# Patient Record
Sex: Male | Born: 1980 | Race: Black or African American | Hispanic: No | Marital: Married | Smoking: Current every day smoker
Health system: Southern US, Community
[De-identification: ages and names within clinical notes are randomized; demographics above are authoritative.]

## PROBLEM LIST (undated history)

## (undated) HISTORY — PX: ANKLE SURGERY: SHX546

---
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
## 2004-06-23 ENCOUNTER — Emergency Department (HOSPITAL_COMMUNITY): Admission: EM | Admit: 2004-06-23 | Discharge: 2004-06-23 | Payer: Self-pay | Admitting: Family Medicine

## 2005-08-21 ENCOUNTER — Encounter: Payer: Self-pay | Admitting: Emergency Medicine

## 2007-06-18 ENCOUNTER — Emergency Department (HOSPITAL_COMMUNITY): Admission: EM | Admit: 2007-06-18 | Discharge: 2007-06-19 | Payer: Self-pay | Admitting: Emergency Medicine

## 2008-12-23 IMAGING — CR DG HAND COMPLETE 3+V*L*
3 series · 3 of 3 positions shown · non-contrast
Comparison: none

CLINICAL DATA: Watch pin stuck in hand. 
 LEFT HAND ? 3 VIEW:

[x hand pa left]
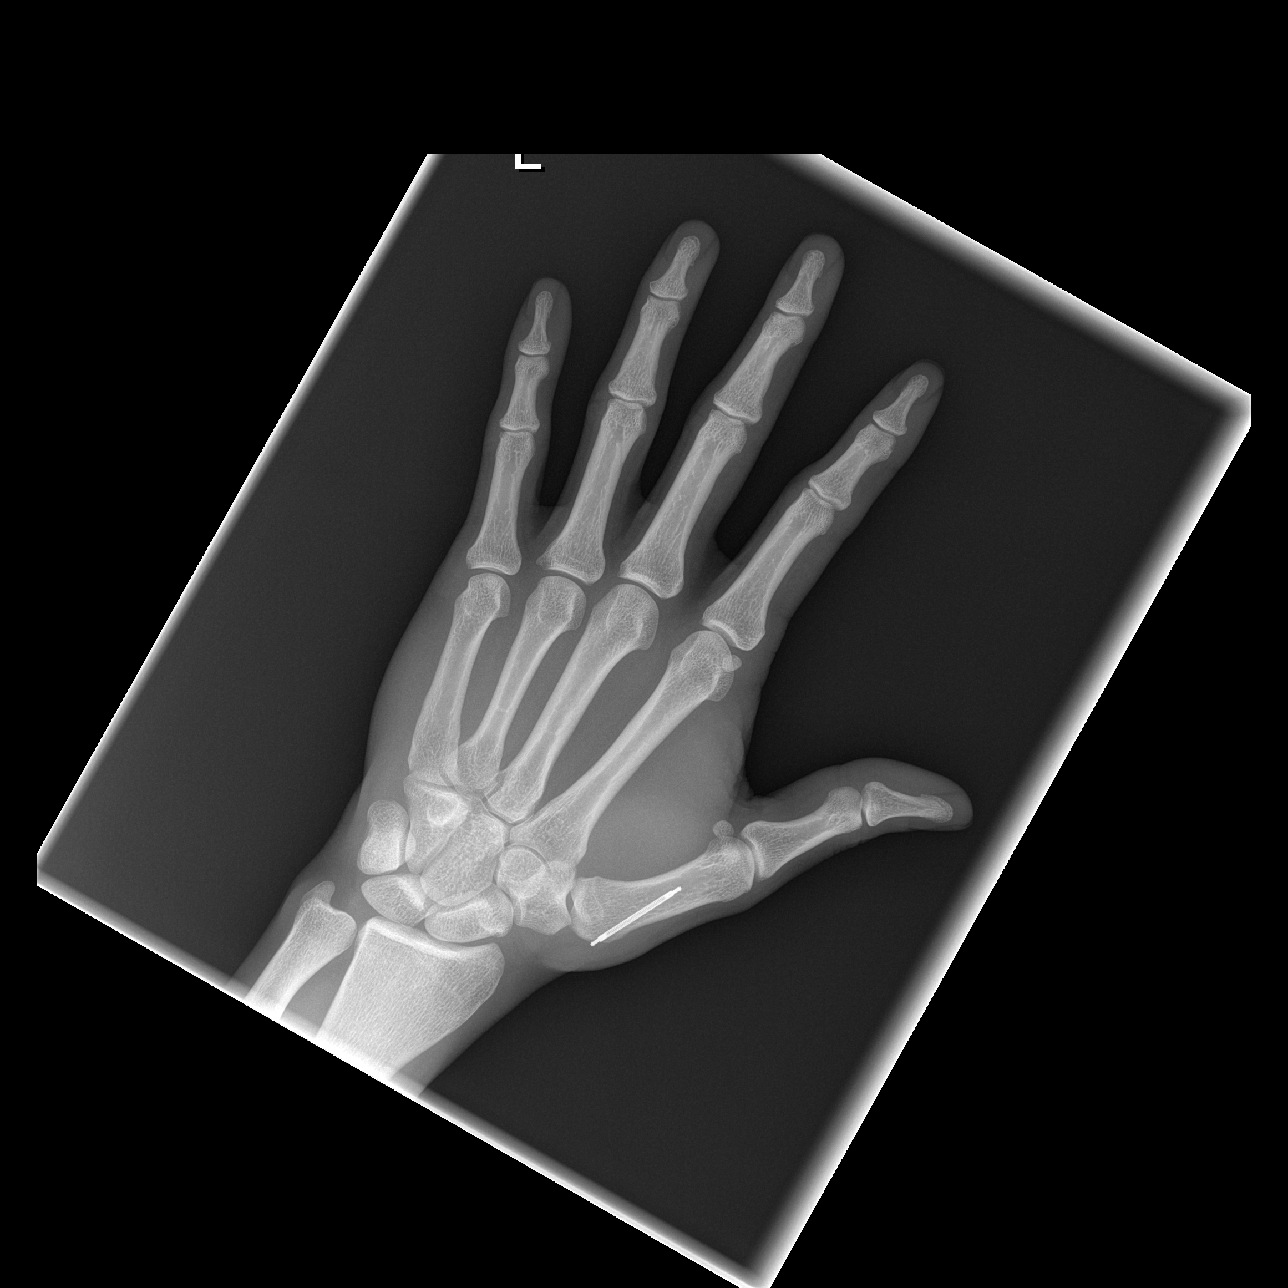

[x hand oblique left]
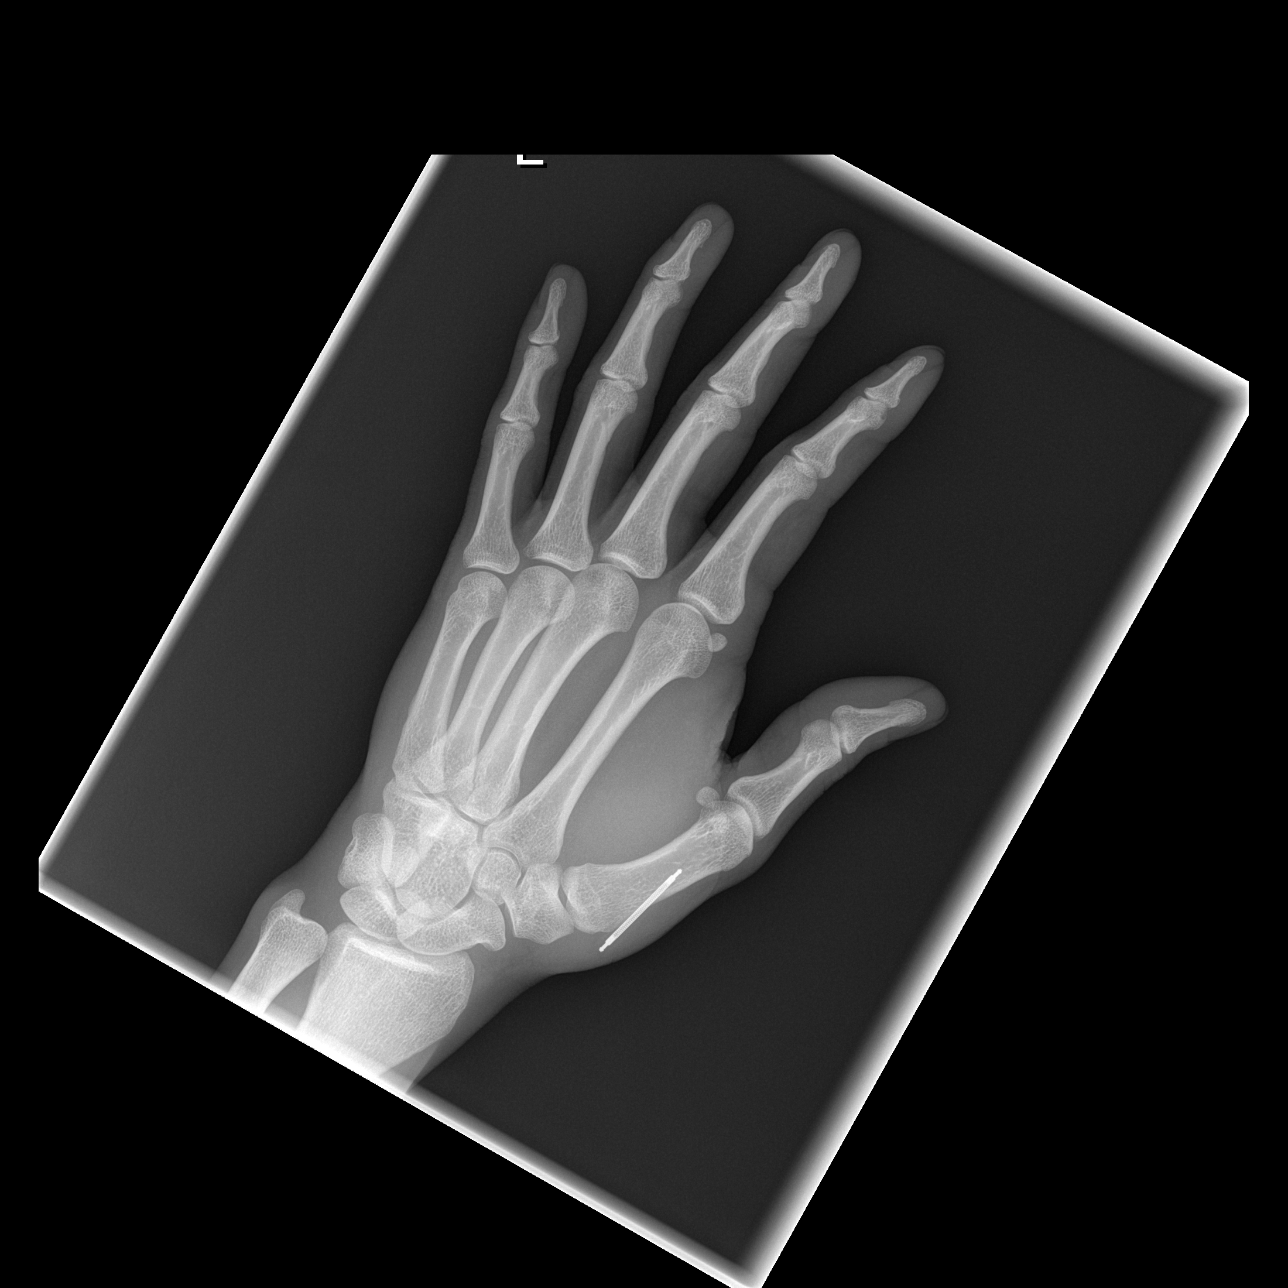

[x hand lat left]
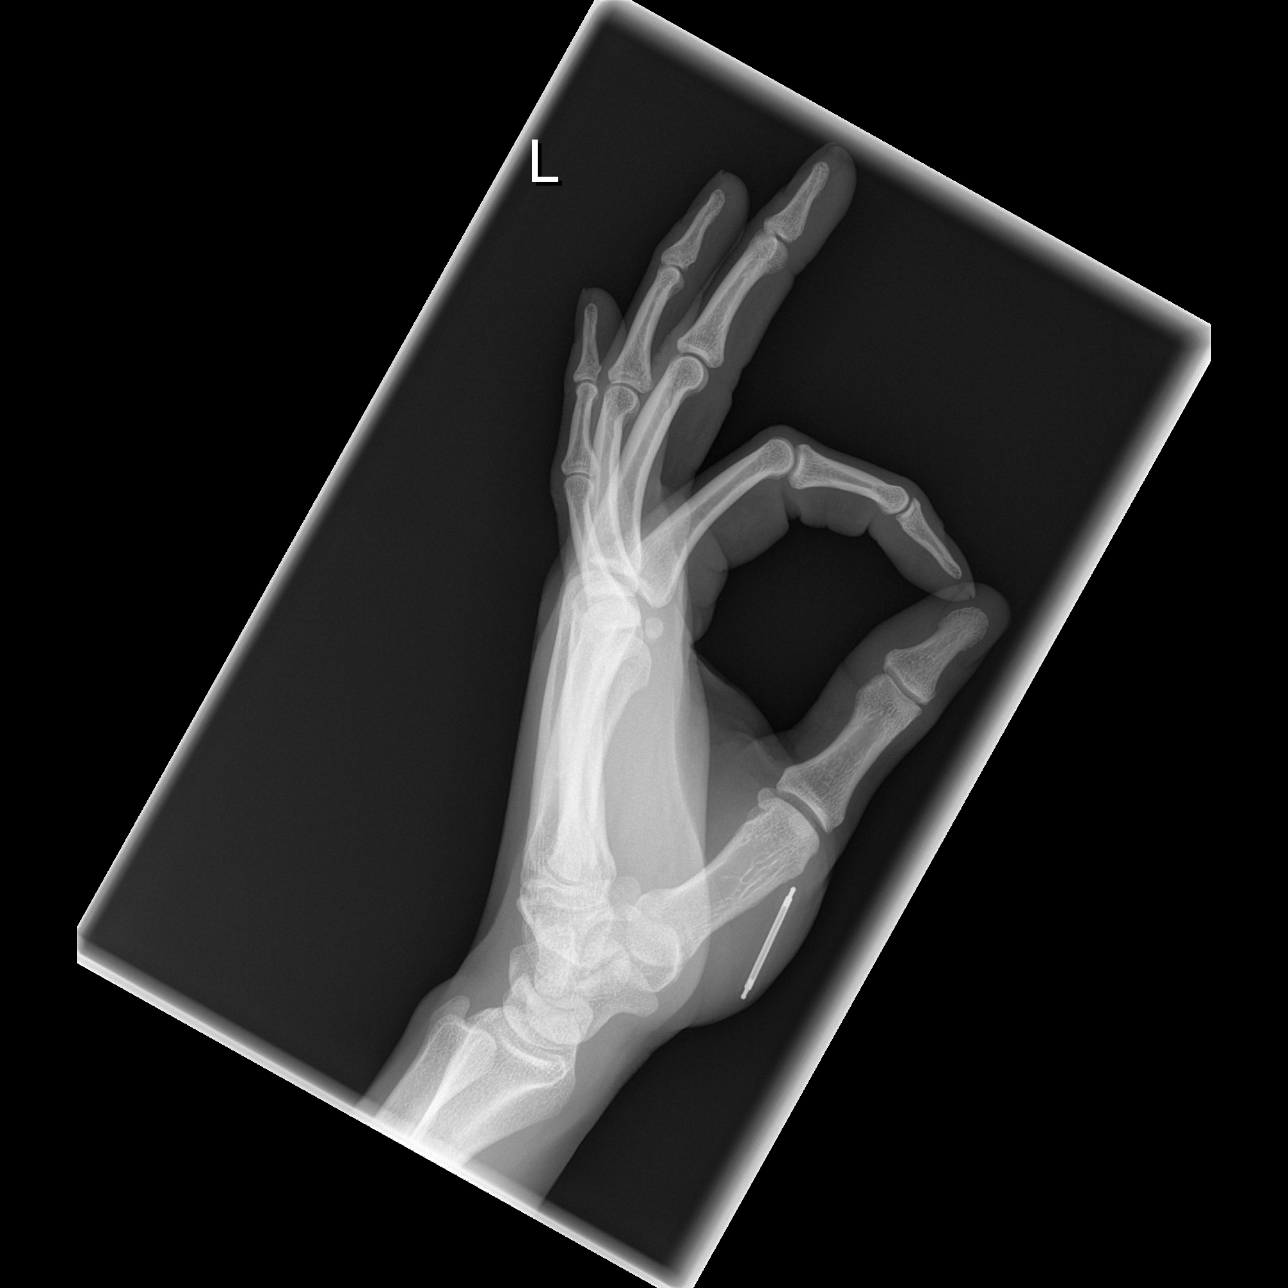

[3 of 3 positions shown; findings below may reference images not displayed]

FINDINGS: There is a radiopaque object in the thenar eminence region that looks like a watch pin.  This does not appear to affect the bone or show any intraarticular communication.
IMPRESSION: As discussed above.

## 2010-05-31 ENCOUNTER — Inpatient Hospital Stay (INDEPENDENT_AMBULATORY_CARE_PROVIDER_SITE_OTHER)
Admission: RE | Admit: 2010-05-31 | Discharge: 2010-05-31 | Disposition: A | Discharge status: Home / Self Care | Payer: Self-pay | Source: Ambulatory Visit | Attending: Family Medicine | Admitting: Family Medicine

## 2010-05-31 DIAGNOSIS — M25569 Pain in unspecified knee: Secondary | ICD-10-CM

## 2010-06-18 ENCOUNTER — Ambulatory Visit: Payer: Self-pay | Attending: Orthopedic Surgery

## 2010-09-04 NOTE — Op Note (Signed)
NAME:  JHON, MALLOZZI NO.:  [id number]   MEDICAL RECORD NO.:  [id number]          PATIENT TYPE:  EMS   LOCATION:  MAJO                         FACILITY:  [facility]   PHYSICIAN:  Dionne Ano. Gramig III, M.D.DATE OF BIRTH:  [birth year]   DATE OF PROCEDURE:  06/18/2007  DATE OF DISCHARGE:                               OPERATIVE REPORT   Lochlan Grygiel presents to the [facility] Emergency Room for evaluation  of his left thumb after embedding a 1 inch watch band metal part into  his thenar space deeply. He was seen by the emergency room staff. Given  the complexity, I was asked to see him.  He has been given a tetanus  shot.  He notes no other complaints.   ALLERGIES:  None.   MEDICINES:  None.   PAST SURGICAL HISTORY:  Ankle surgery.   PAST MEDICAL HISTORY:  None.   SOCIAL HISTORY:  He has a place of employment at [employer].  He has  a significant other and a child with him tonight.   The examination is highlighted by an entrance wound in the thenar space.  He has limited open area where exploration by the ER staff was  unsuccessful.  He has no evidence of gross flexor tendon disruption or  obvious dystrophy or infection.  I have reviewed this at length and his  findings.   The patient has x-rays revealing an imbedded object consistent with a  watch band attachment in his thenar space.   I have verbally consented him for I&D and removal of the apparatus.  The  patient was taken to the procedure suite.  He underwent additional  infiltration block and following this, under fluoro, I was able to  localize the object. Once this was complete, I then performed removal  with an open technique utilizing a 15 blade and utilizing sterile  instruments.  This was removed. The area was irrigated and closed with  Prolene.   Following this, I discussed with him elevation, massage, and a dressing  change in five days.  I have written him out of work until Monday.  I  have asked him to return to the office in 7-10 days for follow up and  suture removal at which time we will simply remove his sutures and allow  him activities to tolerance.  I have given him Keflex x10 days, 500 mg  one p.o. q.i.d. and Vicodin p.r.n. pain.  All questions have been  encouraged and answered.           ______________________________  Dionne Ano. Everlene Other, M.D.    Nash Mantis  D:  06/19/2007  T:  06/19/2007  Job:  [id number]   cc:   Hilario Quarry, M.D.

## 2011-07-26 ENCOUNTER — Emergency Department (HOSPITAL_COMMUNITY)
Admission: EM | Admit: 2011-07-26 | Discharge: 2011-07-26 | Disposition: A | Discharge status: Home Health | Payer: Self-pay | Source: Home / Self Care | Attending: Family Medicine | Admitting: Family Medicine

## 2011-07-26 ENCOUNTER — Encounter (HOSPITAL_COMMUNITY): Payer: Self-pay

## 2011-07-26 DIAGNOSIS — S0180XA Unspecified open wound of other part of head, initial encounter: Secondary | ICD-10-CM

## 2011-07-26 DIAGNOSIS — S01112A Laceration without foreign body of left eyelid and periocular area, initial encounter: Secondary | ICD-10-CM

## 2011-07-26 MED ORDER — MUPIROCIN CALCIUM 2 % EX CREA: TOPICAL_CREAM | Freq: Three times a day (TID) | CUTANEOUS | Status: AC

## 2011-07-26 NOTE — ED Notes (Signed)
Pt c/o laceration above L eyebrow.  Pt states he collided head to head while playing basketball, denies LOC.  Bleeding controlled upon arrival.

## 2011-07-26 NOTE — Discharge Instructions (Signed)
Follow laceration care instructions below. Let dry well before putting antibiotic ointment.  Can cover it going outside the house. Return in 5 days for suture removal. Return earlier if redness, pain swelling or drainage. Go to the emergency department if dizziness, balance problem, gait problems, memory problems, visual problems, nausea, vomiting, seizures or persistent headache.    Laceration Care, Adult A laceration is a cut or lesion that goes through all layers of the skin and into the tissue just beneath the skin. TREATMENT  Some lacerations may not require closure. Some lacerations may not be able to be closed due to an increased risk of infection. It is important to see your caregiver as soon as possible after an injury to minimize the risk of infection and maximize the opportunity for successful closure. If closure is appropriate, pain medicines may be given, if needed. The wound will be cleaned to help prevent infection. Your caregiver will use stitches (sutures), staples, wound glue (adhesive), or skin adhesive strips to repair the laceration. These tools bring the skin edges together to allow for faster healing and a better cosmetic outcome. However, all wounds will heal with a scar. Once the wound has healed, scarring can be minimized by covering the wound with sunscreen during the day for 1 full year. HOME CARE INSTRUCTIONS  For sutures or staples:  Keep the wound clean and dry.   If you were given a bandage (dressing), you should change it at least once a day. Also, change the dressing if it becomes wet or dirty, or as directed by your caregiver.   Wash the wound with soap and water 2 times a day. Rinse the wound off with water to remove all soap. Pat the wound dry with a clean towel.   After cleaning, apply a thin layer of the antibiotic ointment as recommended by your caregiver. This will help prevent infection and keep the dressing from sticking.   You may shower as usual  after the first 24 hours. Do not soak the wound in water until the sutures are removed.   Only take over-the-counter or prescription medicines for pain, discomfort, or fever as directed by your caregiver.   Get your sutures or staples removed as directed by your caregiver.  For skin adhesive strips:  Keep the wound clean and dry.   Do not get the skin adhesive strips wet. You may bathe carefully, using caution to keep the wound dry.   If the wound gets wet, pat it dry with a clean towel.   Skin adhesive strips will fall off on their own. You may trim the strips as the wound heals. Do not remove skin adhesive strips that are still stuck to the wound. They will fall off in time.  For wound adhesive:  You may briefly wet your wound in the shower or bath. Do not soak or scrub the wound. Do not swim. Avoid periods of heavy perspiration until the skin adhesive has fallen off on its own. After showering or bathing, gently pat the wound dry with a clean towel.   Do not apply liquid medicine, cream medicine, or ointment medicine to your wound while the skin adhesive is in place. This may loosen the film before your wound is healed.   If a dressing is placed over the wound, be careful not to apply tape directly over the skin adhesive. This may cause the adhesive to be pulled off before the wound is healed.   Avoid prolonged exposure to sunlight or  tanning lamps while the skin adhesive is in place. Exposure to ultraviolet light in the first year will darken the scar.   The skin adhesive will usually remain in place for 5 to 10 days, then naturally fall off the skin. Do not pick at the adhesive film.  You may need a tetanus shot if:  You cannot remember when you had your last tetanus shot.   You have never had a tetanus shot.  If you get a tetanus shot, your arm may swell, get red, and feel warm to the touch. This is common and not a problem. If you need a tetanus shot and you choose not to have  one, there is a rare chance of getting tetanus. Sickness from tetanus can be serious. SEEK MEDICAL CARE IF:   You have redness, swelling, or increasing pain in the wound.   You see a red line that goes away from the wound.   You have yellowish-white fluid (pus) coming from the wound.   You have a fever.   You notice a bad smell coming from the wound or dressing.   Your wound breaks open before or after sutures have been removed.   You notice something coming out of the wound such as wood or glass.   Your wound is on your hand or foot and you cannot move a finger or toe.  SEEK IMMEDIATE MEDICAL CARE IF:   Your pain is not controlled with prescribed medicine.   You have severe swelling around the wound causing pain and numbness or a change in color in your arm, hand, leg, or foot.   Your wound splits open and starts bleeding.   You have worsening numbness, weakness, or loss of function of any joint around or beyond the wound.   You develop painful lumps near the wound or on the skin anywhere on your body.  MAKE SURE YOU:   Understand these instructions.   Will watch your condition.   Will get help right away if you are not doing well or get worse.  Document Released: 04/08/2005 Document Revised: 03/28/2011 Document Reviewed: 10/02/2010 [facility] Patient Information 2012 [place], [place].

## 2011-07-29 NOTE — ED Provider Notes (Signed)
History     CSN: [id number]  Arrival date & time 07/26/11  1700   First MD Initiated Contact with Patient 07/26/11 1713      Chief Complaint  Patient presents with  . Laceration    (Consider location/radiation/quality/duration/timing/severity/associated sxs/prior treatment) HPI Comments: 31 y/o otherwise healthy here c/o a laceration in forehead after a head to head collision with another basketball player during a game. Denies dizziness or loss of consciousness, denies nausea or headache. No visual problems from base line. (patient had a penetrating injury to left eye years ago). Bleeding has stopped. Injury happened about 1 hour ago.    History reviewed. No pertinent past medical history.  Past Surgical History  Procedure Date  . Ankle surgery     No family history on file.  History  Substance Use Topics  . Smoking status: Current Everyday Smoker  . Smokeless tobacco: Not on file  . Alcohol Use: Yes      Review of Systems  Skin:       Forehead laceration as per HPI.  Neurological: Negative for dizziness, seizures, weakness, light-headedness, numbness and headaches.  All other systems reviewed and are negative.    Allergies  Review of patient's allergies indicates no known allergies.  Home Medications   Current Outpatient Rx  Name Route Sig Dispense Refill  . MUPIROCIN CALCIUM 2 % EX CREA Topical Apply topically 3 (three) times daily. 15 g 0    BP 124/55  Pulse 56  Temp(Src) 97.3 F (36.3 C) (Oral)  Resp 18  SpO2 100%  Physical Exam  Nursing note and vitals reviewed. Constitutional: He is oriented to person, place, and time. He appears well-developed and well-nourished. No distress.  HENT:       Forehead laceration, about 5 cm. Horizontal just above left eyebrow. Hemostatic.  Eyes: Conjunctivae and EOM are normal. No scleral icterus.       Pupil irregular on left side from prior penetrating eye injury.  Neck: Normal range of motion. Neck supple.    Cardiovascular: Normal heart sounds.   Pulmonary/Chest: Breath sounds normal.  Neurological: He is alert and oriented to person, place, and time. He has normal strength. No cranial nerve deficit or sensory deficit. He displays a negative Romberg sign. Coordination normal.  Psychiatric: His behavior is normal.    ED Course  LACERATION REPAIR Performed by: Sharin Grave Authorized by: Sharin Grave Consent: Verbal consent obtained. Risks and benefits: risks, benefits and alternatives were discussed Consent given by: patient Body area: head/neck Location details: left eyebrow Laceration length: 5 cm Foreign bodies: no foreign bodies Tendon involvement: none Nerve involvement: none Vascular damage: no Anesthesia: local infiltration Local anesthetic: lidocaine 1% with epinephrine Anesthetic total: 3 ml Preparation: Patient was prepped and draped in the usual sterile fashion. Irrigation solution: saline Irrigation method: syringe Amount of cleaning: standard Debridement: none Degree of undermining: none Skin closure: 5-0 Prolene Subcutaneous closure: 6-0 Chromic gut Number of sutures: 12 Technique: simple Approximation: close Approximation difficulty: simple Dressing: antibiotic ointment and 4x4 sterile gauze Patient tolerance: Patient tolerated the procedure well with no immediate complications.   (including critical care time)  Labs Reviewed - No data to display No results found.   1. Laceration of left eyebrow       MDM  Left forehead laceration s/o rapaire in two layers, with 3 subcutaneus sutures with chromic and 9 simple prolene skin sutures. Wound care instructions provided asked to return in 5 days for suture removal.  Sharin Grave, MD 07/29/11 [phone]

## 2011-08-01 ENCOUNTER — Emergency Department (INDEPENDENT_AMBULATORY_CARE_PROVIDER_SITE_OTHER)
Admission: EM | Admit: 2011-08-01 | Discharge: 2011-08-01 | Disposition: A | Discharge status: Home / Self Care | Payer: Self-pay | Source: Home / Self Care | Attending: Emergency Medicine | Admitting: Emergency Medicine

## 2011-08-01 ENCOUNTER — Encounter (HOSPITAL_COMMUNITY): Payer: Self-pay | Admitting: Cardiology

## 2011-08-01 DIAGNOSIS — Z4802 Encounter for removal of sutures: Secondary | ICD-10-CM

## 2011-08-01 NOTE — ED Notes (Signed)
Pt here for suture removal from left eyebrow. No drainage/fever. Edges well approximated. Tenderness at suture area. Pt had impact to the area initially.

## 2011-08-01 NOTE — Discharge Instructions (Signed)
Go to www.goodrx.com to look up your medications. This will give you a list of where you can find your prescriptions at the most affordable prices.   RESOURCE GUIDE  No Primary Care Doctor Call [facility]  [phone] Other agencies that provide inexpensive medical care    [facility]  [phone]    [facility]  [phone]    [facility]  [phone]    [facility]  [phone] [place] [address]    [facility]  [phone]    [facility]  [phone] [facility] [phone]   [address] [place], [place] [address]  [place] [facility]  [facility] [place]     [employer]                          [facility] [address] [place]                       [address]      [address]   [phone] (After Hours)   

## 2011-08-01 NOTE — ED Provider Notes (Signed)
History     CSN: [id number]  Arrival date & time 08/01/11  4098   First MD Initiated Contact with Patient 08/01/11 1046      Chief Complaint  Patient presents with  . Suture / Staple Removal    (Consider location/radiation/quality/duration/timing/severity/associated sxs/prior treatment) HPI Comments: Patient status post laceration repair 6 days ago. Has been applying Bactroban as directed. Patient currently without complaints.  Patient is a 31 y.o. male presenting with suture removal. No language interpreter was used.  Suture / Staple Removal  The sutures were placed 3 to 6 days ago. Treatments since wound repair include antibiotic ointment use and regular soap and water washings. There has been no drainage from the wound. There is no redness present. There is no swelling present. The pain has no pain. He has no difficulty moving the affected extremity or digit.    History reviewed. No pertinent past medical history.  Past Surgical History  Procedure Date  . Ankle surgery     No family history on file.  History  Substance Use Topics  . Smoking status: Current Everyday Smoker -- 0.1 packs/day  . Smokeless tobacco: Not on file  . Alcohol Use: Yes     occas      Review of Systems  Allergies  Review of patient's allergies indicates no known allergies.  Home Medications   Current Outpatient Rx  Name Route Sig Dispense Refill  . MUPIROCIN CALCIUM 2 % EX CREA Topical Apply topically 3 (three) times daily. 15 g 0    BP 105/69  Pulse 57  Temp(Src) 98.2 F (36.8 C) (Oral)  Resp 12  SpO2 100%  Physical Exam  Nursing note and vitals reviewed. Constitutional: He is oriented to person, place, and time. He appears well-developed and well-nourished.  HENT:  Head: Normocephalic and atraumatic.         Well-healed laceration at left eyebrow. 9 Prolene sutures intact.  Eyes: Conjunctivae and EOM are normal.  Neck: Normal range of motion.  Cardiovascular: Normal  rate.   Pulmonary/Chest: Effort normal. No respiratory distress.  Abdominal: He exhibits no distension.  Musculoskeletal: Normal range of motion.  Neurological: He is alert and oriented to person, place, and time.  Skin: Skin is warm and dry.  Psychiatric: He has a normal mood and affect. His behavior is normal.    ED Course  SUTURE REMOVAL Date/Time: 08/01/2011 7:00 PM Performed by: Luiz Blare Authorized by: Luiz Blare Consent: Verbal consent obtained. Risks and benefits: risks, benefits and alternatives were discussed Consent given by: patient Patient understanding: patient states understanding of the procedure being performed Patient consent: the patient's understanding of the procedure matches consent given Required items: required blood products, implants, devices, and special equipment available Patient identity confirmed: verbally with patient Time out: Immediately prior to procedure a "time out" was called to verify the correct patient, procedure, equipment, support staff and site/side marked as required. Body area: head/neck Location details: left eyebrow Wound Appearance: clean Sutures Removed: 9 Post-removal: antibiotic ointment applied Facility: sutures placed in this facility Patient tolerance: Patient tolerated the procedure well with no immediate complications.   (including critical care time)  Labs Reviewed - No data to display No results found.   1. Visit for suture removal       MDM    Luiz Blare, MD 08/01/11 1901

## 2011-08-01 NOTE — ED Provider Notes (Deleted)
History     CSN: [id number]  Arrival date & time 08/01/11  2130   First MD Initiated Contact with Patient 08/01/11 1046      Chief Complaint  Patient presents with  . Suture / Staple Removal    (Consider location/radiation/quality/duration/timing/severity/associated sxs/prior treatment) The history is provided by the patient.    History reviewed. No pertinent past medical history.  Past Surgical History  Procedure Date  . Ankle surgery     No family history on file.  History  Substance Use Topics  . Smoking status: Current Everyday Smoker -- 0.1 packs/day  . Smokeless tobacco: Not on file  . Alcohol Use: Yes     occas      Review of Systems  Allergies  Review of patient's allergies indicates no known allergies.  Home Medications   Current Outpatient Rx  Name Route Sig Dispense Refill  . MUPIROCIN CALCIUM 2 % EX CREA Topical Apply topically 3 (three) times daily. 15 g 0    BP 105/69  Pulse 57  Temp(Src) 98.2 F (36.8 C) (Oral)  Resp 12  SpO2 100%  Physical Exam  ED Course  SUTURE REMOVAL Date/Time: 08/01/2011 11:03 AM Performed by: Luiz Blare Authorized by: Luiz Blare Consent: Verbal consent obtained. Risks and benefits: risks, benefits and alternatives were discussed Consent given by: patient Patient understanding: patient states understanding of the procedure being performed Patient consent: the patient's understanding of the procedure matches consent given Required items: required blood products, implants, devices, and special equipment available Patient identity confirmed: verbally with patient Time out: Immediately prior to procedure a "time out" was called to verify the correct patient, procedure, equipment, support staff and site/side marked as required. Body area: head/neck Location details: forehead Wound Appearance: clean Sutures Removed: 9 Facility: sutures placed in this facility Patient tolerance: Patient  tolerated the procedure well with no immediate complications.   (including critical care time)  Labs Reviewed - No data to display No results found.   1. Visit for suture removal       MDM  Removed 9 prolene sutures. Laceration healing well.   Luiz Blare, MD 08/01/11 1106  Luiz Blare, MD 08/01/11 1106  Luiz Blare, MD 08/01/11 1106

## 2012-06-29 ENCOUNTER — Encounter (HOSPITAL_COMMUNITY): Payer: Self-pay

## 2012-06-29 ENCOUNTER — Emergency Department (INDEPENDENT_AMBULATORY_CARE_PROVIDER_SITE_OTHER)
Admission: EM | Admit: 2012-06-29 | Discharge: 2012-06-29 | Disposition: A | Discharge status: Home / Self Care | Payer: Self-pay | Source: Home / Self Care

## 2012-06-29 ENCOUNTER — Emergency Department (INDEPENDENT_AMBULATORY_CARE_PROVIDER_SITE_OTHER): Payer: Self-pay

## 2012-06-29 DIAGNOSIS — IMO0002 Reserved for concepts with insufficient information to code with codable children: Secondary | ICD-10-CM

## 2012-06-29 DIAGNOSIS — M6790 Unspecified disorder of synovium and tendon, unspecified site: Secondary | ICD-10-CM

## 2012-06-29 NOTE — ED Provider Notes (Signed)
History     CSN: [id number]  Arrival date & time 06/29/12  1540   First MD Initiated Contact with Patient 06/29/12 1716      Chief Complaint  Patient presents with  . Finger Injury    (Consider location/radiation/quality/duration/timing/severity/associated sxs/prior treatment) HPI This is a 32 year old male who injured his right index finger while playing basketball on 2/28. He states that he hyperextended it points to the proximal interphalangeal joint where most of the extension occurred. He subsequently began taking 2-3 tabs of ibuprofen once a day and applied a splint. However pain and swelling have not improved and he's developed some mild mottling on the volar aspect of the finger near the base and therefore he presents to the urgent care. History reviewed. No pertinent past medical history.  Past Surgical History  Procedure Laterality Date  . Ankle surgery      History reviewed. No pertinent family history.  History  Substance Use Topics  . Smoking status: Current Every Day Smoker -- 0.10 packs/day  . Smokeless tobacco: Not on file  . Alcohol Use: Yes     Comment: occas      Review of Systems  Constitutional: Negative.   HENT: Negative.   Eyes: Negative.   Respiratory: Negative.   Cardiovascular: Negative.   Genitourinary: Negative.   Musculoskeletal:       Per history of present illness  Skin: Negative.   Neurological: Negative.   Hematological: Negative.   Psychiatric/Behavioral: Negative.     Allergies  Review of patient's allergies indicates no known allergies.  Home Medications  No current outpatient prescriptions on file.  BP 109/74  Pulse 76  Temp(Src) 98.2 F (36.8 C) (Oral)  Resp 20  SpO2 99%  Physical Exam  Constitutional: He appears well-developed and well-nourished.  HENT:  Head: Normocephalic and atraumatic.  Eyes: Conjunctivae are normal. Pupils are equal, round, and reactive to light.  Neck: Normal range of motion.    Cardiovascular: Normal rate and regular rhythm.   Pulmonary/Chest: Effort normal and breath sounds normal.  Abdominal: Soft. Bowel sounds are normal.  Musculoskeletal:       Hands: Swelling and tenderness in proximal inter-phalangeal joint with most of the tendernes over the medial and lateral aspect of the joint. Mottling noted on finger- see diagram. Pt is unable to flex at the joint- passive flexion is not possible either mostly due to pain.      ED Course  Procedures (including critical care time)  Labs Reviewed - No data to display Dg Finger Index Right  06/29/2012  *RADIOLOGY REPORT*  Clinical Data: Finger injury.  Injury while playing basketball.  RIGHT INDEX FINGER 2+V  Comparison: None.  Findings: There is no evidence for acute fracture or dislocation. No soft tissue foreign body or gas identified.  IMPRESSION: Negative exam.   Original Report Authenticated By: Norva Pavlov, M.D.      1. Tendon injury       MDM  Referral to hand surgery. Continue splint and Ibuprofen- can increase to every 6 hrs as needed for pain.          Calvert Cantor, MD 06/29/12 [phone]

## 2012-06-29 NOTE — ED Notes (Signed)
Hyperextention injury to right index finger 2-28 during basketball game; pain , swelling DIP joint og finger, has treated w self applied splint

## 2013-05-09 ENCOUNTER — Emergency Department (HOSPITAL_COMMUNITY)
Admission: EM | Admit: 2013-05-09 | Discharge: 2013-05-09 | Disposition: A | Discharge status: Home / Self Care | Payer: 59 | Attending: Emergency Medicine | Admitting: Emergency Medicine

## 2013-05-09 ENCOUNTER — Encounter (HOSPITAL_COMMUNITY): Payer: Self-pay | Admitting: Emergency Medicine

## 2013-05-09 DIAGNOSIS — S0501XA Injury of conjunctiva and corneal abrasion without foreign body, right eye, initial encounter: Secondary | ICD-10-CM

## 2013-05-09 DIAGNOSIS — Y9367 Activity, basketball: Secondary | ICD-10-CM | POA: Insufficient documentation

## 2013-05-09 DIAGNOSIS — S058X9A Other injuries of unspecified eye and orbit, initial encounter: Secondary | ICD-10-CM | POA: Insufficient documentation

## 2013-05-09 DIAGNOSIS — W219XXA Striking against or struck by unspecified sports equipment, initial encounter: Secondary | ICD-10-CM | POA: Insufficient documentation

## 2013-05-09 DIAGNOSIS — Y9239 Other specified sports and athletic area as the place of occurrence of the external cause: Secondary | ICD-10-CM | POA: Insufficient documentation

## 2013-05-09 DIAGNOSIS — Y92838 Other recreation area as the place of occurrence of the external cause: Secondary | ICD-10-CM

## 2013-05-09 DIAGNOSIS — F17200 Nicotine dependence, unspecified, uncomplicated: Secondary | ICD-10-CM | POA: Insufficient documentation

## 2013-05-09 MED ORDER — TRAMADOL HCL 50 MG PO TABS
50.0000 mg | ORAL_TABLET | Freq: Once | ORAL | Status: AC
  Administered 2013-05-09: 50 mg via ORAL
  Filled 2013-05-09: qty 1

## 2013-05-09 MED ORDER — TRAMADOL HCL 50 MG PO TABS: 50.0000 mg | ORAL_TABLET | Freq: Four times a day (QID) | ORAL | Status: AC | PRN

## 2013-05-09 MED ORDER — TOBRAMYCIN 0.3 % OP SOLN
1.0000 [drp] | OPHTHALMIC | Status: DC
  Administered 2013-05-09: 1 [drp] via OPHTHALMIC
  Filled 2013-05-09: qty 5

## 2013-05-09 MED ORDER — PROPARACAINE HCL 0.5 % OP SOLN
1.0000 [drp] | Freq: Once | OPHTHALMIC | Status: DC
  Filled 2013-05-09: qty 15

## 2013-05-09 MED ORDER — FLUORESCEIN SODIUM 1 MG OP STRP
1.0000 | ORAL_STRIP | Freq: Once | OPHTHALMIC | Status: DC
  Filled 2013-05-09: qty 1

## 2013-05-09 MED ORDER — GENTAMICIN SULFATE 0.3 % OP SOLN: 1.0000 [drp] | OPHTHALMIC | Status: AC

## 2013-05-09 NOTE — ED Notes (Signed)
Fluorescein and proparacaine at bedside for PA.

## 2013-05-09 NOTE — ED Notes (Signed)
Poss fb in his rt eye after he was playing basketball earlier tonight.  Pain ful since.  He does not wear contacts

## 2013-05-09 NOTE — ED Provider Notes (Signed)
CSN: [id number]     Arrival date & time 05/09/13  2158 History   This chart was scribed for non-physician practitioner Junius FinnerErin O'Malley, PA-C working with Brandt LoosenJulie Manly, MD by Donne Anonayla Curran, ED Scribe. This patient was seen in room TR04C/TR04C and the patient's care was started at 2233.   First MD Initiated Contact with Patient 05/09/13 2233     Chief Complaint  Patient presents with  . Eye Pain    The history is provided by the patient. No language interpreter was used.   HPI Comments: Steven Maxwell is a 33 y.o. male who presents to the Emergency Department complaining of a possible foreign body in his right eye that began while he was playing basketball earlier tonight. He states he was hit in the eye, felt like someone scratched his eye and possibly has something in his eye. He reports moderate, constant pain. He tried flushing his eye with little relief. He does not wear contacts or correctly lenses. He denies visual disturbances or any other symptoms. He reports he previously tore his iris in his left eye. He currently smokes.  He does not currently have an opthalmologist.   History reviewed. No pertinent past medical history. Past Surgical History  Procedure Laterality Date  . Ankle surgery     No family history on file. History  Substance Use Topics  . Smoking status: Current Every Day Smoker -- 0.10 packs/day  . Smokeless tobacco: Not on file  . Alcohol Use: Yes     Comment: occas    Review of Systems  Eyes: Positive for pain. Negative for visual disturbance.  All other systems reviewed and are negative.    Allergies  Review of patient's allergies indicates no known allergies.  Home Medications   Current Outpatient Rx  Name  Route  Sig  Dispense  Refill  . gentamicin (GARAMYCIN) 0.3 % ophthalmic solution   Right Eye   Place 1 drop into the right eye every 4 (four) hours.   5 mL   0   . traMADol (ULTRAM) 50 MG tablet   Oral   Take 1 tablet (50 mg total) by mouth  every 6 (six) hours as needed for moderate pain.   10 tablet   0     BP 121/64  Pulse 55  Temp(Src) 98.6 F (37 C) (Oral)  Resp 18  SpO2 100%  Physical Exam  Nursing note and vitals reviewed. Constitutional: He is oriented to person, place, and time. He appears well-developed and well-nourished.  HENT:  Head: Normocephalic and atraumatic.  Eyes: EOM and lids are normal. Lids are everted and swept, no foreign bodies found.  Slit lamp exam:      The right eye shows corneal abrasion and fluorescein uptake.  Corneal abrasion and fluorescin uptake in right eye. No foreign bodies.  Neck: Normal range of motion.  Cardiovascular: Normal rate.   Pulmonary/Chest: Effort normal.  Musculoskeletal: Normal range of motion.  Neurological: He is alert and oriented to person, place, and time.  Skin: Skin is warm and dry.  Psychiatric: He has a normal mood and affect. His behavior is normal.    ED Course  Procedures (including critical care time) DIAGNOSTIC STUDIES: Oxygen Saturation is 100% on RA, normal by my interpretation.    COORDINATION OF CARE: 11:22 PM Discussed treatment plan which includes slit eye lamp exam with pt at bedside and pt agreed to plan. Discussed scratch on cornea. Advised pt to follow up with opthalmologist. Opthalmology referral given.  Labs Review Labs Reviewed - No data to display Imaging Review No results found.  EKG Interpretation   None       MDM   1. Corneal abrasion, right     Corneal abrasion of right eye. No foreign bodies. Will give 1st dose gentamycin ophthalmic drops in ED. F/u with Dr. Charlotte Sanes, ophthalmology in 2 days if symptoms not improving. Return precautions provided. Pt verbalized understanding and agreement with tx plan.   I personally performed the services described in this documentation, which was scribed in my presence. The recorded information has been reviewed and is accurate.    Junius Finner, PA-C 05/09/13 2345

## 2013-05-10 NOTE — ED Provider Notes (Signed)
Medical screening examination/treatment/procedure(s) were performed by non-physician practitioner and as supervising physician I was immediately available for consultation/collaboration.  EKG Interpretation   None         Laray AngerKathleen M Aubriegh Minch, DO 05/10/13 1005

## 2021-06-02 ENCOUNTER — Encounter (HOSPITAL_COMMUNITY): Payer: Self-pay

## 2021-06-02 ENCOUNTER — Other Ambulatory Visit: Payer: Self-pay

## 2021-06-02 ENCOUNTER — Ambulatory Visit (HOSPITAL_COMMUNITY)
Admission: EM | Admit: 2021-06-02 | Discharge: 2021-06-02 | Disposition: A | Discharge status: Home / Self Care | Payer: No Typology Code available for payment source | Attending: Family Medicine | Admitting: Family Medicine

## 2021-06-02 DIAGNOSIS — M25512 Pain in left shoulder: Secondary | ICD-10-CM | POA: Diagnosis not present

## 2021-06-02 MED ORDER — PREDNISONE 20 MG PO TABS: 40.0000 mg | ORAL_TABLET | Freq: Every day | ORAL | 0 refills | Status: AC

## 2021-06-02 MED ORDER — DICLOFENAC SODIUM 75 MG PO TBEC: 75.0000 mg | DELAYED_RELEASE_TABLET | Freq: Two times a day (BID) | ORAL | 0 refills | Status: AC

## 2021-06-02 NOTE — ED Triage Notes (Signed)
Pt presents with c/op L arm soreness and swelling x 2 days.   States he thinks the issue started at work.

## 2021-06-04 NOTE — ED Provider Notes (Signed)
°  [place]   CD:[id number] 06/02/21 Arrival Time: 1034  ASSESSMENT & PLAN:  1. Acute pain of left shoulder    No plain imaging today.  Discharge Medication List as of 06/02/2021 12:56 PM     START taking these medications   Details  diclofenac (VOLTAREN) 75 MG EC tablet Take 1 tablet (75 mg total) by mouth 2 (two) times daily., Starting Sat 06/02/2021, Normal    predniSONE (DELTASONE) 20 MG tablet Take 2 tablets (40 mg total) by mouth daily., Starting Sat 06/02/2021, Normal         Recommend:  Follow-up Information     [place].   Why: If worsening or failing to improve as anticipated. Contact information: [address] [place] [phone]                Reviewed expectations re: course of current medical issues. Questions answered. Outlined signs and symptoms indicating need for more acute intervention. Patient verbalized understanding. After Visit Summary given.  SUBJECTIVE: History from: patient.   Steven Maxwell is a 41 y.o. male who reports L arm soreness; "feels swollen"; x 2 days. No trauma. Feels this is from lifting. No extremity sensation changes or weakness. No tx PTA.  Past Surgical History:  Procedure Laterality Date   ANKLE SURGERY        OBJECTIVE:  Vitals:   06/02/21 1155  BP: 124/82  Pulse: 62  Resp: 18  Temp: 98.7 F (37.1 C)  TempSrc: Oral  SpO2: 98%    General appearance: alert; no distress HEENT: [place]; AT Neck: supple with FROM Resp: unlabored respirations Extremities: LUE: warm with well perfused appearance; no specific TTP; mostly "sore" over anterior shoulder; FROM CV: brisk extremity capillary refill of LUE; 2+ radial pulse of LUE. Skin: warm and dry; no visible rashes Neurologic: gait normal; normal sensation and strength of LUE Psychological: alert and cooperative; normal mood and affect      No Known Allergies  History reviewed. No  pertinent past medical history. Social History   Socioeconomic History   Marital status: Married    Spouse name: Not on file   Number of children: Not on file   Years of education: Not on file   Highest education level: Not on file  Occupational History   Not on file  Tobacco Use   Smoking status: Every Day    Packs/day: 0.10    Types: Cigarettes   Smokeless tobacco: Not on file  Substance and Sexual Activity   Alcohol use: Yes    Comment: occas   Drug use: No   Sexual activity: Not on file  Other Topics Concern   Not on file  Social History Narrative   Not on file   Social Determinants of Health   Financial Resource Strain: Not on file  Food Insecurity: Not on file  Transportation Needs: Not on file  Physical Activity: Not on file  Stress: Not on file  Social Connections: Not on file   History reviewed. No pertinent family history. Past Surgical History:  Procedure Laterality Date   ANKLE SURGERY         Vanessa Kick, MD 06/04/21 [phone]

## 2023-02-24 ENCOUNTER — Encounter (HOSPITAL_COMMUNITY): Payer: Self-pay

## 2023-02-24 ENCOUNTER — Ambulatory Visit (HOSPITAL_COMMUNITY)
Admission: EM | Admit: 2023-02-24 | Discharge: 2023-02-24 | Disposition: A | Discharge status: Home / Self Care | Payer: BC Managed Care – PPO | Attending: Emergency Medicine | Admitting: Emergency Medicine

## 2023-02-24 DIAGNOSIS — H00012 Hordeolum externum right lower eyelid: Secondary | ICD-10-CM | POA: Diagnosis not present

## 2023-02-24 MED ORDER — ERYTHROMYCIN 5 MG/GM OP OINT: TOPICAL_OINTMENT | OPHTHALMIC | 0 refills | Status: AC

## 2023-02-24 NOTE — Discharge Instructions (Addendum)
You can use the antibiotic ointment 4 times daily over the next 5 days to help prevent bacterial infection.  You can do this after you do the warm compresses.  You can continue with the stye drops and the ointment.  Your symptoms should improve over the next week.  If no improvement please follow-up with an eye doctor.

## 2023-02-24 NOTE — ED Triage Notes (Signed)
Patient here today with c/o a stye on his right bottom eyelid X 1 week. He has been using some OTC stye ointment and eye drops with some relief.

## 2023-02-24 NOTE — ED Provider Notes (Signed)
[facility]    CSN: [id number] Arrival date & time: 02/24/23  1614      History   Chief Complaint Chief Complaint  Patient presents with   Eye Problem    HPI Steven Maxwell is a 42 y.o. male.   Patient presents to clinic complaining of a stye to his right lower eyelid that has been present for the past week.  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  He does wear eye protection, but they are not the best glasses.  He does not wear contacts.  He denies any pain or vision loss.  He has been using warm compresses to the eyelid 2-3 times daily with an over-the-counter stye ointment and stye drops.   The history is provided by the patient and medical records.  Eye Problem   History reviewed. No pertinent past medical history.  There are no problems to display for this patient.   Past Surgical History:  Procedure Laterality Date   ANKLE SURGERY         Home Medications    Prior to Admission medications   Medication Sig Start Date End Date Taking? Authorizing Provider  erythromycin ophthalmic ointment Place a 1/2 inch ribbon of ointment into the lower eyelid 4x daily for the next 5 days. 02/24/23  Yes Rinaldo Ratel, [place] N, FNP  diclofenac (VOLTAREN) 75 MG EC tablet Take 1 tablet (75 mg total) by mouth 2 (two) times daily. 06/02/21   Mardella Layman, MD  gentamicin (GARAMYCIN) 0.3 % ophthalmic solution Place 1 drop into the right eye every 4 (four) hours. 05/09/13   Lurene Shadow, PA-C  predniSONE (DELTASONE) 20 MG tablet Take 2 tablets (40 mg total) by mouth daily. 06/02/21   Mardella Layman, MD  traMADol (ULTRAM) 50 MG tablet Take 1 tablet (50 mg total) by mouth every 6 (six) hours as needed for moderate pain. 05/09/13   Lurene Shadow, PA-C    Family History History reviewed. No pertinent family history.  Social History Social History   Tobacco Use   Smoking status: Former    Current packs/day: 0.10    Types:  Cigarettes  Vaping Use   Vaping status: Never Used  Substance Use Topics   Alcohol use: Yes    Comment: occas   Drug use: No     Allergies   Patient has no known allergies.   Review of Systems Review of Systems   Physical Exam Triage Vital Signs ED Triage Vitals  Encounter Vitals Group     BP 02/24/23 1742 133/72     Systolic BP Percentile --      Diastolic BP Percentile --      Pulse Rate 02/24/23 1742 (!) 58     Resp 02/24/23 1742 16     Temp 02/24/23 1742 98.1 F (36.7 C)     Temp Source 02/24/23 1742 Oral     SpO2 02/24/23 1742 97 %     Weight 02/24/23 1742 210 lb (95.3 kg)     Height 02/24/23 1742 6\' 3"  (1.905 m)     Head Circumference --      Peak Flow --      Pain Score 02/24/23 1741 0     Pain Loc --      Pain Education --      Exclude from Growth Chart --    No data found.  Updated Vital Signs BP 133/72 (BP Location: Right Arm)  Pulse (!) 58   Temp 98.1 F (36.7 C) (Oral)   Resp 16   Ht 6\' 3"  (1.905 m)   Wt 210 lb (95.3 kg)   SpO2 97%   BMI 26.25 kg/m   Visual Acuity Right Eye Distance:   Left Eye Distance:   Bilateral Distance:    Right Eye Near:   Left Eye Near:    Bilateral Near:     Physical Exam Vitals and nursing note reviewed.  Constitutional:      Appearance: Normal appearance.  HENT:     Head: Normocephalic and atraumatic.     Right Ear: External ear normal.     Left Ear: External ear normal.     Nose: Nose normal.     Mouth/Throat:     Mouth: Mucous membranes are moist.  Eyes:     General: Lids are everted, no foreign bodies appreciated. Vision grossly intact. Gaze aligned appropriately. No scleral icterus.       Right eye: Hordeolum present. No discharge.        Left eye: No discharge.     Conjunctiva/sclera: Conjunctivae normal.     Pupils: Pupils are equal, round, and reactive to light.      Comments: Stye to right lower eyelid.  No obvious head or drainage.  Conjunctival without injection.  No discharge.   Cardiovascular:     Rate and Rhythm: Normal rate.  Pulmonary:     Effort: Pulmonary effort is normal. No respiratory distress.  Musculoskeletal:        General: Normal range of motion.  Neurological:     General: No focal deficit present.     Mental Status: He is alert.  Psychiatric:        Mood and Affect: Mood normal.      UC Treatments / Results  Labs (all labs ordered are listed, but only abnormal results are displayed) Labs Reviewed - No data to display  EKG   Radiology No results found.  Procedures Procedures (including critical care time)  Medications Ordered in UC Medications - No data to display  Initial Impression / Assessment and Plan / UC Course  I have reviewed the triage vital signs and the nursing notes.  Pertinent labs & imaging results that were available during my care of the patient were reviewed by me and considered in my medical decision making (see chart for details).  Vitals and triage reviewed, patient is hemodynamically stable.  Right lower eyelid hordeolum present on physical exam.  Vision grossly intact.  Will cover for bacterial etiology with erythromycin ointment and encouraged symptomatic management.  Ophthalmology follow-up if no improvement despite intervention over the next 7 days.  Plan of care, follow-up care and return precautions given, no questions at this time.    Final Clinical Impressions(s) / UC Diagnoses   Final diagnoses:  Hordeolum externum of right lower eyelid     Discharge Instructions      You can use the antibiotic ointment 4 times daily over the next 5 days to help prevent bacterial infection.  You can do this after you do the warm compresses.  You can continue with the stye drops and the ointment.  Your symptoms should improve over the next week.  If no improvement please follow-up with an eye doctor.    ED Prescriptions     Medication Sig Dispense Auth. Provider   erythromycin ophthalmic ointment Place a  1/2 inch ribbon of ointment into the lower eyelid 4x daily for the  next 5 days. 3.5 g Shayanne Gomm, [place] N, FNP      PDMP not reviewed this encounter.   Farooq Petrovich, [place] N, [place] 02/24/23 [phone]
